# Patient Record
Sex: Male | Born: 1995 | Race: White | Hispanic: No | Marital: Single
Health system: Southern US, Community
[De-identification: ages and names within clinical notes are randomized; demographics above are authoritative.]

---
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
## 2016-12-12 ENCOUNTER — Emergency Department
Admission: EM | Admit: 2016-12-12 | Discharge: 2016-12-12 | Disposition: A | Discharge status: Home / Self Care | Payer: Managed Care, Other (non HMO) | Attending: Emergency Medicine | Admitting: Emergency Medicine

## 2016-12-12 ENCOUNTER — Emergency Department: Payer: Managed Care, Other (non HMO)

## 2016-12-12 DIAGNOSIS — R0602 Shortness of breath: Secondary | ICD-10-CM | POA: Diagnosis present

## 2016-12-12 DIAGNOSIS — J302 Other seasonal allergic rhinitis: Secondary | ICD-10-CM | POA: Diagnosis not present

## 2016-12-12 MED ORDER — LORATADINE 10 MG PO TABS: 10.0000 mg | ORAL_TABLET | Freq: Every day | ORAL | 0 refills | Status: AC

## 2016-12-12 MED ORDER — METHYLPREDNISOLONE 4 MG PO TABS
4.0000 mg | ORAL_TABLET | Freq: Every day | ORAL | 0 refills | Status: AC
Start: 2016-12-12 — End: ?

## 2016-12-12 MED ORDER — LORATADINE 10 MG PO TABS
10.0000 mg | ORAL_TABLET | Freq: Once | ORAL | Status: AC
  Administered 2016-12-12: 10 mg via ORAL
  Filled 2016-12-12: qty 1

## 2016-12-12 NOTE — ED Triage Notes (Signed)
Patient reports feeling short of breath, like he just can't take a deep breath.  Patient with no increase work of breathing noted, no acute respiratory distress noted. Lungs clear bilaterally.

## 2016-12-12 NOTE — ED Provider Notes (Signed)
[facility] Emergency Department Provider Note  ____________________________________________   First MD Initiated Contact with Patient 12/12/16 2256     (approximate)  I have reviewed the triage vital signs and the nursing notes.   HISTORY  Chief Complaint Shortness of Breath   HPI Timothy Rasmussen is a 21 y.o. male without any chronic medical conditions was presenting to the emergency department today with 2 hours of shortness of breath. He says that about 9 PM he started becoming very short of breath as well as with nasal congestion, itchy eyes and throat. He says that he is a [employer] and went outside of his dorm room there are a lot of plants which exacerbate his seasonal allergies. He denies any chest pain although he says that he was unable to take a very deep breath which made him concerned to come to the emergency department. He has not tried any over-the-counter medications prior to arrival. He says that his symptoms have improved since coming to the emergency department and likely being removed from the allergen. He denies any recent long car trips or plane trips. Denies any smoking or hormone supplementation.   No past medical history on file.  There are no active problems to display for this patient.   No past surgical history on file.  Prior to Admission medications   Medication Sig Start Date End Date Taking? Authorizing Provider  loratadine (CLARITIN) 10 MG tablet Take 1 tablet (10 mg total) by mouth daily. 12/12/16 12/27/16  Myrna Blazer, MD  methylPREDNISolone (MEDROL) 4 MG tablet Take 1 tablet (4 mg total) by mouth daily. Follow directions on the dosepak. 12/12/16   Myrna Blazer, MD    Allergies Patient has no known allergies.  No family history on file.  Social History Social History  Substance Use Topics  . Smoking status: Not on file  . Smokeless tobacco: Not on file  . Alcohol use Not on file     Review of Systems Constitutional: No fever/chills Eyes: No visual changes. ENT: No sore throat. Cardiovascular: Denies chest pain. Respiratory: as above Gastrointestinal: No abdominal pain.  No nausea, no vomiting.  No diarrhea.  No constipation. Genitourinary: Negative for dysuria. Musculoskeletal: Negative for back pain. Skin: Negative for rash. Neurological: Negative for headaches, focal weakness or numbness.  10-point ROS otherwise negative.  ____________________________________________   PHYSICAL EXAM:  VITAL SIGNS: ED Triage Vitals  Enc Vitals Group     BP 12/12/16 2216 133/76     Pulse Rate 12/12/16 2216 63     Resp 12/12/16 2216 20     Temp 12/12/16 2216 98.3 F (36.8 C)     Temp Source 12/12/16 2216 Oral     SpO2 12/12/16 2216 99 %     Weight 12/12/16 2214 190 lb (86.2 kg)     Height 12/12/16 2214  (1.905 m)     Head Circumference --      Peak Flow --      Pain Score 12/12/16 2244 0     Pain Loc --      Pain Edu? --      Excl. in GC? --     Constitutional: Alert and oriented. Well appearing and in no acute distress. Eyes: Conjunctivae are normal. PERRL. EOMI. Head: Atraumatic. Nose: Mild erythema to the bilateral nasal mucosa as well as clear rhinorrhea. Mouth/Throat: Mucous membranes are moist.  Oropharynx non-erythematous. Neck: No stridor.   Cardiovascular: Normal rate, regular rhythm. Grossly normal heart  sounds.   Respiratory: Normal respiratory effort.  No retractions. Lungs CTAB. Gastrointestinal: Soft and nontender. No distention. Musculoskeletal: No lower extremity tenderness nor edema.  No joint effusions. Neurologic:  Normal speech and language. No gross focal neurologic deficits are appreciated.  Skin:  Skin is warm, dry and intact. No rash noted. Psychiatric: Mood and affect are normal. Speech and behavior are normal.  ____________________________________________   LABS (all labs ordered are listed, but only abnormal results are  displayed)  Labs Reviewed - No data to display ____________________________________________  EKG   ____________________________________________  RADIOLOGY DG Chest 2 View (Final result)  Result time 12/12/16 23:07:43  Final result by Rise Mu, MD (12/12/16 23:07:43)           Narrative:   CLINICAL DATA: Initial evaluation for acute dyspnea.  EXAM: CHEST 2 VIEW  COMPARISON: None.  FINDINGS: The cardiac and mediastinal silhouettes are within normal limits.  The lungs are normally inflated. No airspace consolidation, pleural effusion, or pulmonary edema is identified. There is no pneumothorax.  No acute osseous abnormality identified.  IMPRESSION: No active cardiopulmonary disease.   Electronically Signed By: Rise Mu M.D. On: 12/12/2016 23:07             ____________________________________________   PROCEDURES  Procedure(s) performed:   Procedures  Critical Care performed:   ____________________________________________   INITIAL IMPRESSION / ASSESSMENT AND PLAN / ED COURSE  Pertinent labs & imaging results that were available during my care of the patient were reviewed by me and considered in my medical decision making (see chart for details).  PERC negative.   Patient with a very reassuring exam as well as normal vital signs. We'll try the patient with Claritin. We discussed progression to site Medrol if the symptoms do not improve. However, at this point the patient was without any respiratory distress. No wheezing. No facial or intraoral swelling. No stridor. Patient is understanding of the plan and willing to comply. Will be discharged home.      ____________________________________________   FINAL CLINICAL IMPRESSION(S) / ED DIAGNOSES  Final diagnoses:  Shortness of breath  Seasonal allergic rhinitis, unspecified trigger      NEW MEDICATIONS STARTED DURING THIS VISIT:  New Prescriptions    LORATADINE (CLARITIN) 10 MG TABLET    Take 1 tablet (10 mg total) by mouth daily.   METHYLPREDNISOLONE (MEDROL) 4 MG TABLET    Take 1 tablet (4 mg total) by mouth daily. Follow directions on the dosepak.     Note:  This document was prepared using Dragon voice recognition software and may include unintentional dictation errors.    Myrna Blazer, MD 12/12/16 [phone]

## 2016-12-12 NOTE — ED Notes (Signed)
Pt taken to xray 

## 2018-09-13 IMAGING — CR DG CHEST 2V
2 series · 2 of 2 positions shown · non-contrast
Comparison: None.

CLINICAL DATA: Initial evaluation for acute dyspnea.

EXAM:
CHEST  2 VIEW

[chest pa]
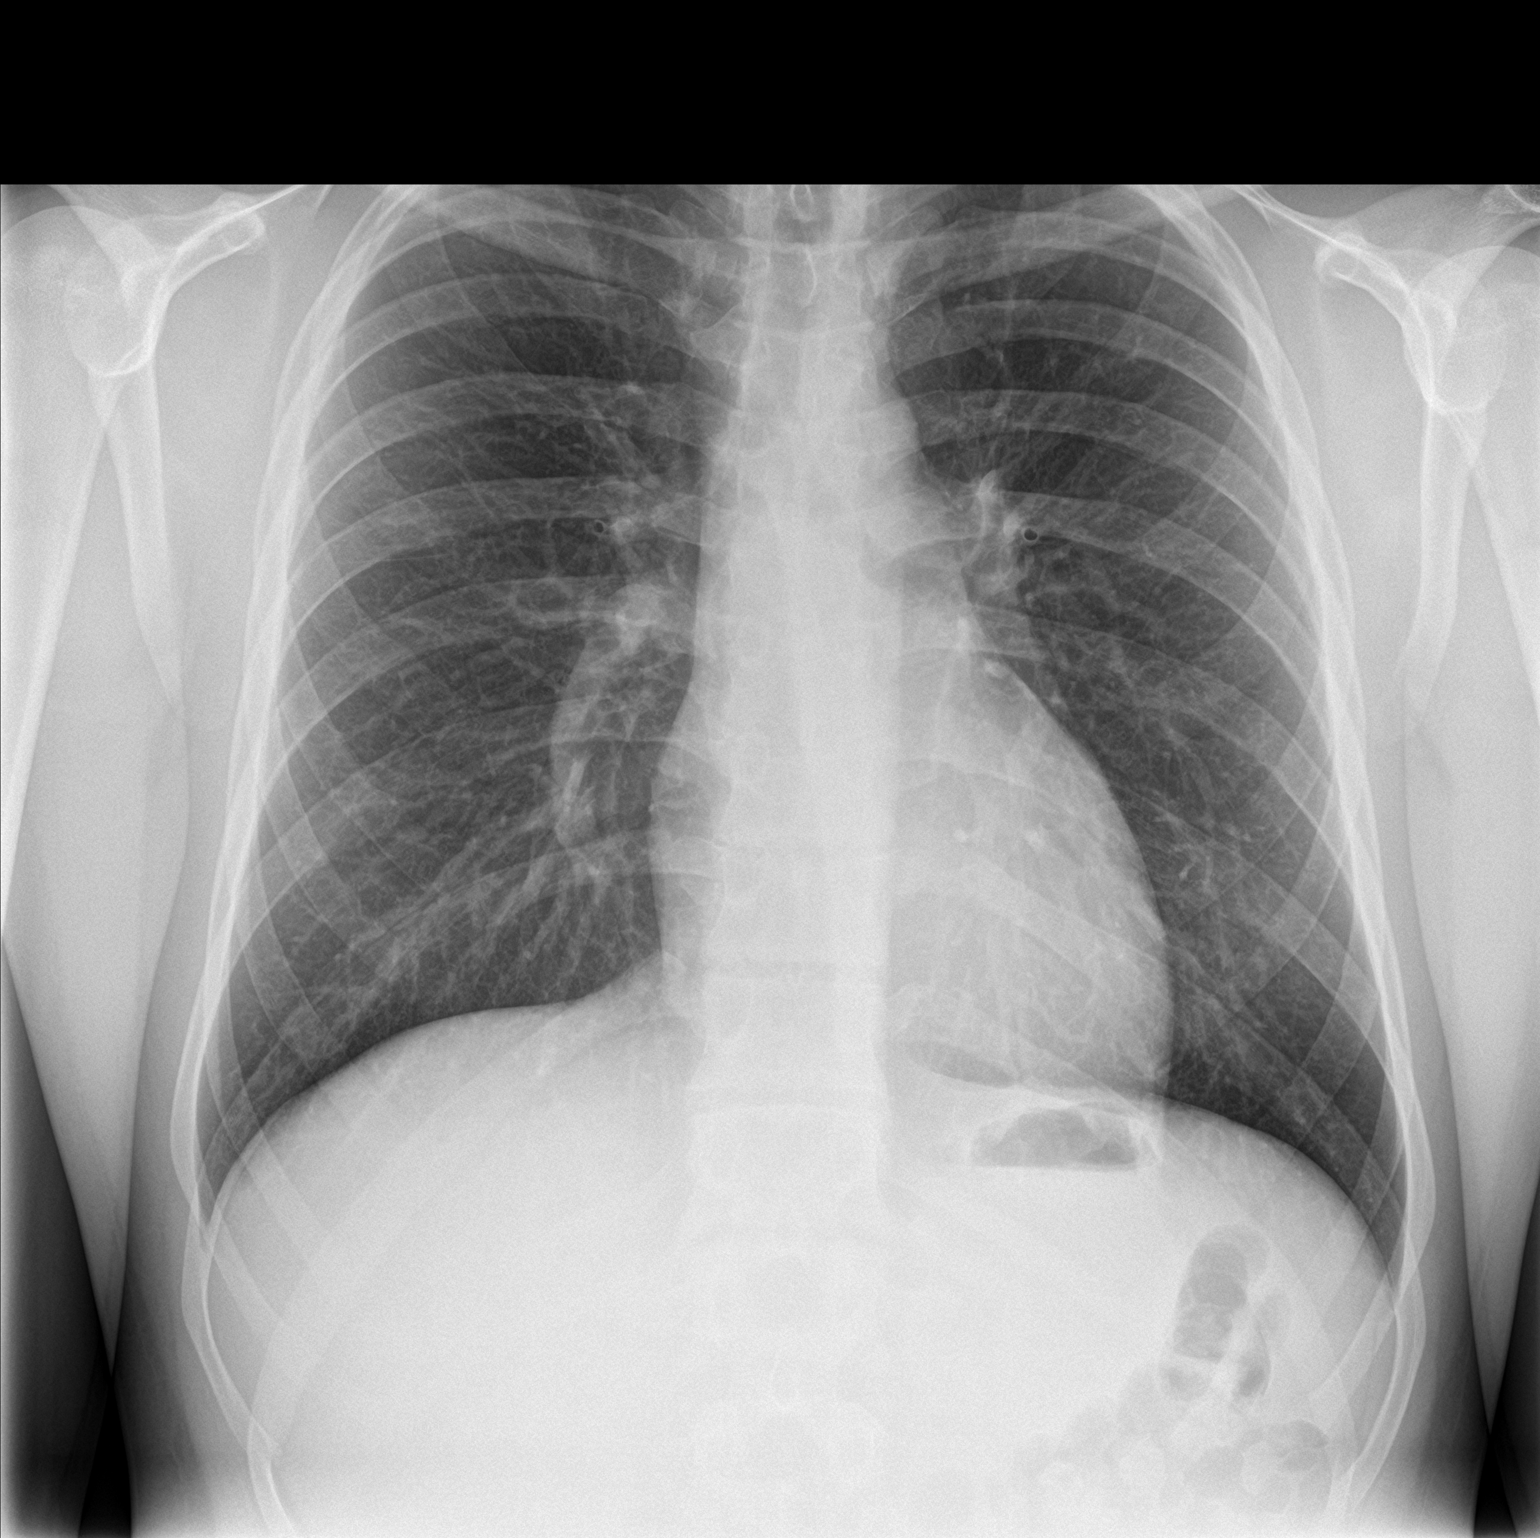

[chest lat]
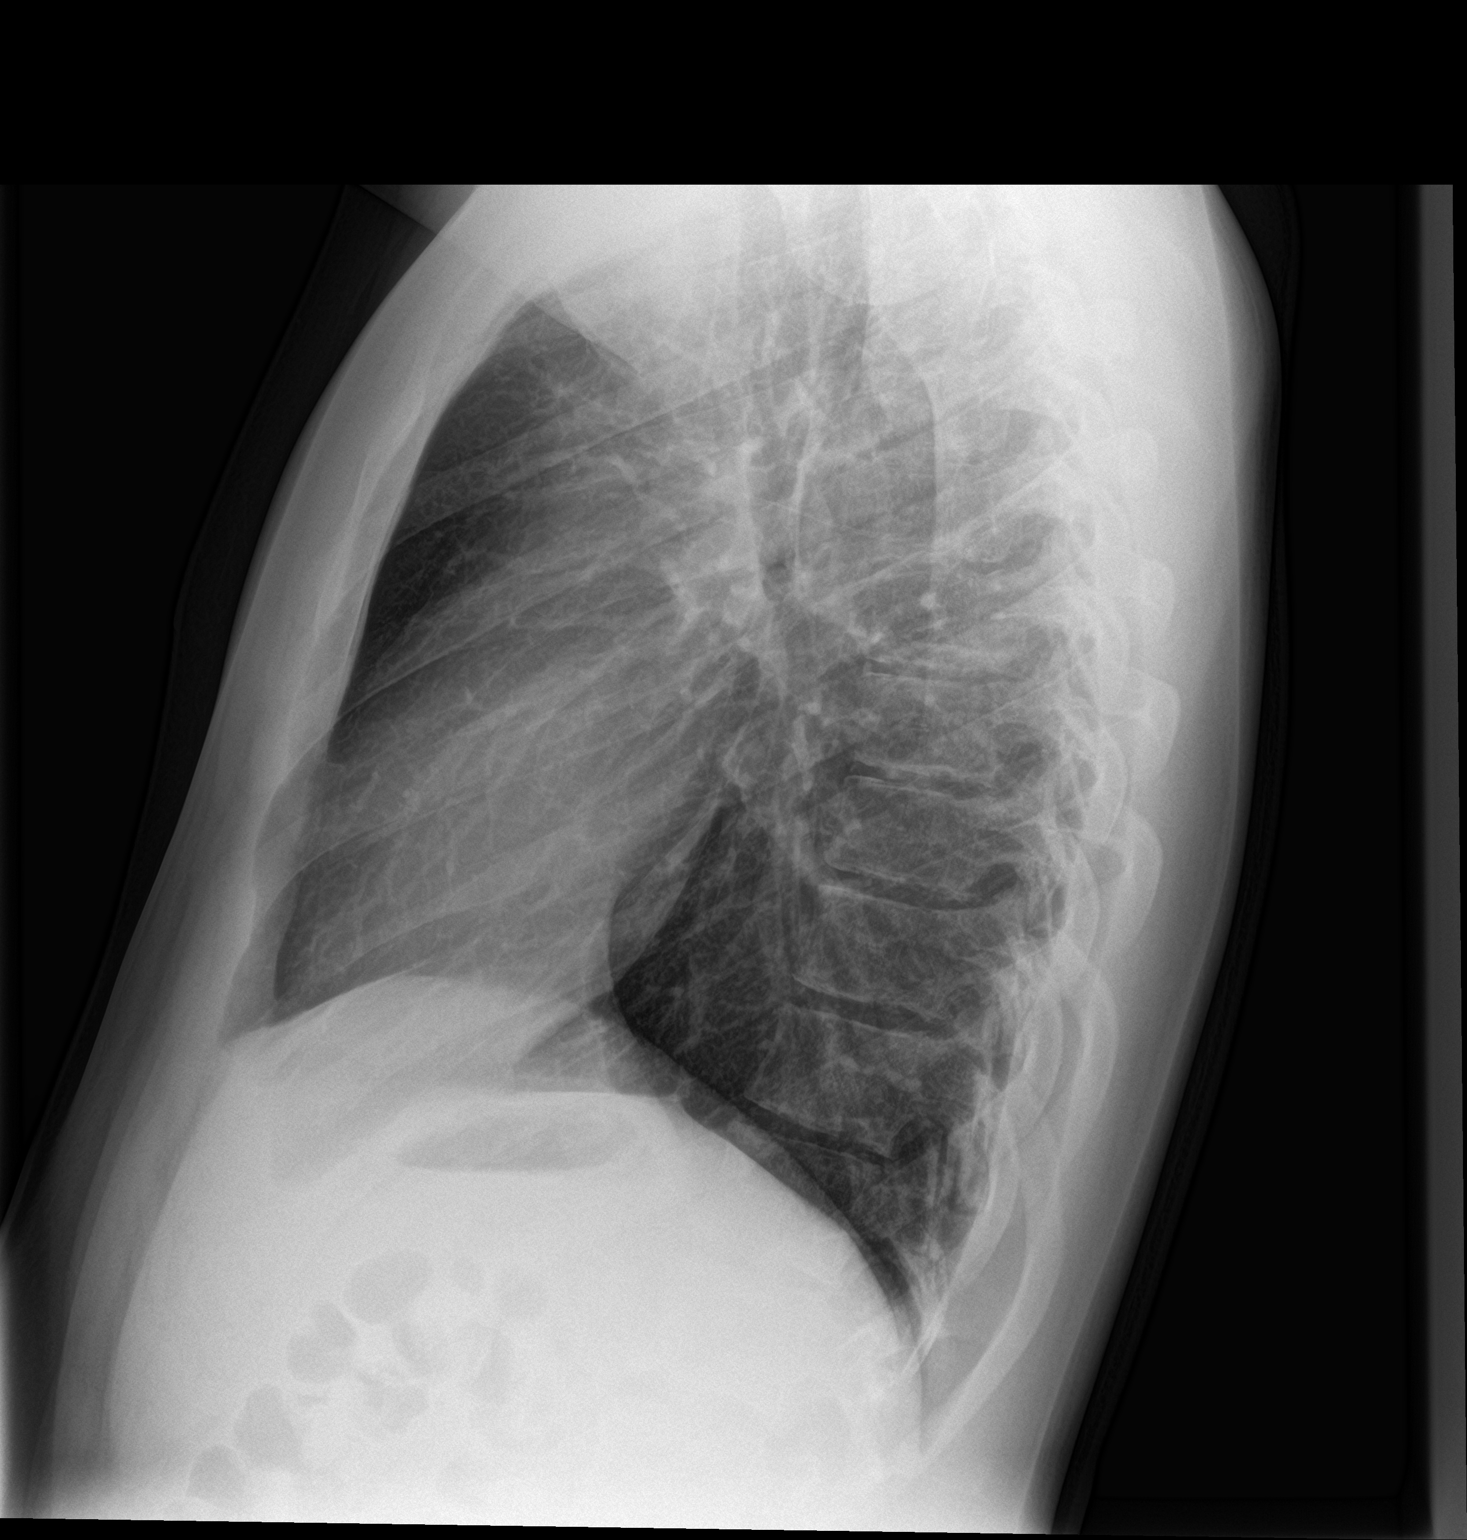

[2 of 2 positions shown; findings below may reference images not displayed]

FINDINGS: The cardiac and mediastinal silhouettes are within normal limits.

The lungs are normally inflated. No airspace consolidation, pleural
effusion, or pulmonary edema is identified. There is no
pneumothorax.

No acute osseous abnormality identified.
IMPRESSION: No active cardiopulmonary disease.
